# Patient Record
Sex: Male | Born: 1990 | Race: White | Hispanic: No | Marital: Single | Smoking: Former smoker
Health system: Southern US, Community
[De-identification: ages and names within clinical notes are randomized; demographics above are authoritative.]

## PROBLEM LIST (undated history)

## (undated) DIAGNOSIS — R05 Cough: Secondary | ICD-10-CM

## (undated) DIAGNOSIS — T07XXXA Unspecified multiple injuries, initial encounter: Secondary | ICD-10-CM

## (undated) DIAGNOSIS — S82142A Displaced bicondylar fracture of left tibia, initial encounter for closed fracture: Secondary | ICD-10-CM

## (undated) HISTORY — PX: WISDOM TOOTH EXTRACTION: SHX21

---

## 2016-11-25 ENCOUNTER — Emergency Department (HOSPITAL_BASED_OUTPATIENT_CLINIC_OR_DEPARTMENT_OTHER)
Admission: EM | Admit: 2016-11-25 | Discharge: 2016-11-26 | Disposition: A | Discharge status: Home / Self Care | Payer: Self-pay | Attending: Emergency Medicine | Admitting: Emergency Medicine

## 2016-11-25 ENCOUNTER — Emergency Department (HOSPITAL_BASED_OUTPATIENT_CLINIC_OR_DEPARTMENT_OTHER): Payer: Self-pay

## 2016-11-25 ENCOUNTER — Encounter (HOSPITAL_BASED_OUTPATIENT_CLINIC_OR_DEPARTMENT_OTHER): Payer: Self-pay | Admitting: *Deleted

## 2016-11-25 ENCOUNTER — Other Ambulatory Visit: Payer: Self-pay

## 2016-11-25 DIAGNOSIS — Y998 Other external cause status: Secondary | ICD-10-CM | POA: Insufficient documentation

## 2016-11-25 DIAGNOSIS — S82142A Displaced bicondylar fracture of left tibia, initial encounter for closed fracture: Secondary | ICD-10-CM | POA: Insufficient documentation

## 2016-11-25 DIAGNOSIS — W208XXA Other cause of strike by thrown, projected or falling object, initial encounter: Secondary | ICD-10-CM | POA: Insufficient documentation

## 2016-11-25 DIAGNOSIS — F1210 Cannabis abuse, uncomplicated: Secondary | ICD-10-CM | POA: Insufficient documentation

## 2016-11-25 DIAGNOSIS — Y9389 Activity, other specified: Secondary | ICD-10-CM | POA: Insufficient documentation

## 2016-11-25 DIAGNOSIS — S8702XA Crushing injury of left knee, initial encounter: Secondary | ICD-10-CM | POA: Insufficient documentation

## 2016-11-25 DIAGNOSIS — Y929 Unspecified place or not applicable: Secondary | ICD-10-CM | POA: Insufficient documentation

## 2016-11-25 DIAGNOSIS — F17200 Nicotine dependence, unspecified, uncomplicated: Secondary | ICD-10-CM | POA: Insufficient documentation

## 2016-11-25 DIAGNOSIS — T07XXXA Unspecified multiple injuries, initial encounter: Secondary | ICD-10-CM

## 2016-11-25 HISTORY — DX: Unspecified multiple injuries, initial encounter: T07.XXXA

## 2016-11-25 HISTORY — DX: Displaced bicondylar fracture of left tibia, initial encounter for closed fracture: S82.142A

## 2016-11-25 MED ORDER — OXYCODONE-ACETAMINOPHEN 5-325 MG PO TABS
2.0000 | ORAL_TABLET | Freq: Once | ORAL | Status: AC | PRN
  Administered 2016-11-25: 2 via ORAL
  Filled 2016-11-25: qty 2

## 2016-11-25 NOTE — ED Provider Notes (Addendum)
[facility] Provider Note: Lowella DellJ. Lane Adelita Hone, MD, FACEP  CSN: [id number] MRN: [id number] ARRIVAL: 11/25/16 at 2144 ROOM: MH07/MH07   CHIEF COMPLAINT  Knee Injury   HISTORY OF PRESENT ILLNESS  11/25/16 10:54 PM Karene FryCharles Bloom is a 26 y.o. male who had a stack of lumber fall onto his left knee about 8 PM.  He is having pain and swelling in his left knee.  He rates his pain as a 2 out of 10 at rest.  Pain is worse with attempted movement or palpation.  He is not able to bear weight on that leg.  There is no numbness, or functional deficit distally.  He also struck his head and back when this occurred but is having minimal pain there.  He denies loss of consciousness, nausea or vomiting.  [facility] [place] [facility] [place] substances [place] reveals the patient has received one prescription for hydrocodone in the past year.   History reviewed. No pertinent past medical history.  History reviewed. No pertinent surgical history.  No family history on file.  Social History   Tobacco Use  . Smoking status: Current Some Day Smoker  . Smokeless tobacco: Never Used  Substance Use Topics  . Alcohol use: Yes  . Drug use: Yes    Types: Marijuana    Prior to Admission medications   Not on File    Allergies Patient has no known allergies.   REVIEW OF SYSTEMS  Negative except as noted here or in the History of Present Illness.   PHYSICAL EXAMINATION  Initial Vital Signs Blood pressure 102/62, pulse 85, temperature 98.4 F (36.9 C), temperature source Oral, resp. rate 18, height 5\' 11"  (1.803 m), weight 68 kg (150 lb), SpO2 100 %.  Examination General: Well-developed, well-nourished male in no acute distress; appearance consistent with age of record HENT: normocephalic; small left occipital hematoma Eyes: pupils equal, round and reactive to light; extraocular muscles intact Neck: supple; no C-spine tenderness Heart: regular rate and rhythm Lungs:  clear to auscultation bilaterally Abdomen: soft; nondistended; nontender; bowel sounds present Back: No T-spine or L-spine tenderness Extremities: No deformity; full range of motion except right knee; pulses normal; tenderness and swelling of right knee with palpable effusion, right lower extremity distally neurovascularly intact with intact tendon function, compartments soft Neurologic: Awake, alert and oriented; motor function intact in all extremities and symmetric; no facial droop Skin: Warm and dry Psychiatric: Normal mood and affect   RESULTS  Summary of this visit's results, reviewed by myself:   EKG Interpretation  Date/Time:    Ventricular Rate:    PR Interval:    QRS Duration:   QT Interval:    QTC Calculation:   R Axis:     Text Interpretation:        Laboratory Studies: No results found for this or any previous visit (from the past 24 hour(s)). Imaging Studies: Dg Knee Complete 4 Views Left  Result Date: 11/25/2016 CLINICAL DATA:  26 y/o  M; crush injury of the knee with pain. EXAM: LEFT KNEE - COMPLETE 4+ VIEW COMPARISON:  None. FINDINGS: Comminuted acute fracture with mild displacement involving the lateral tibial plateau extending into the proximal metaphysis. Fracture line extends to the lateral tibial spine. No joint dislocation. Moderate joint effusion. IMPRESSION: Acute comminuted and displaced fracture of the lateral tibial plateau with fractures extending into lateral tibial spine and proximal metaphysis. Moderate joint effusion. Electronically Signed   By: Mitzi HansenLance  Furusawa-Stratton M.D.   On: 11/25/2016 22:40  ED COURSE  Nursing notes and initial vitals signs, including pulse oximetry, reviewed.  Vitals:   11/25/16 2151 11/26/16 0050  BP: 102/62 126/70  Pulse: 85 88  Resp: 18 18  Temp: 98.4 F (36.9 C)   TempSrc: Oral   SpO2: 100% 99%  Weight: 68 kg (150 lb)   Height: 5\' 11"  (1.803 m)     PROCEDURES   Patient placed in knee immobilizer and  crutches.  He will be strictly nonweightbearing and we will have him follow-up with Dr. Eulah PontMurphy.  Compartments are still soft and distal pulses are +2.  ED DIAGNOSES     ICD-10-CM   1. Tibial plateau fracture, left, closed, initial encounter S82.142A   2. Crushing injury of left knee, initial encounter [id number]        Lisbeth Puller, MD 11/26/16 [id number]    Paula LibraMolpus, Shmiel Morton, MD 11/26/16 [id number]

## 2016-11-25 NOTE — ED Triage Notes (Signed)
Left knee injury. A wall of wood came down hitting him causing him to fall. Injury to his left knee.

## 2016-11-26 ENCOUNTER — Other Ambulatory Visit (HOSPITAL_COMMUNITY): Payer: Self-pay | Admitting: Orthopedic Surgery

## 2016-11-26 DIAGNOSIS — M25562 Pain in left knee: Secondary | ICD-10-CM

## 2016-11-26 MED ORDER — OXYCODONE-ACETAMINOPHEN 5-325 MG PO TABS: 1.0000 | ORAL_TABLET | ORAL | 0 refills | Status: DC | PRN

## 2016-12-01 ENCOUNTER — Other Ambulatory Visit: Payer: Self-pay

## 2016-12-01 ENCOUNTER — Encounter (HOSPITAL_COMMUNITY): Payer: Self-pay

## 2016-12-01 ENCOUNTER — Ambulatory Visit (HOSPITAL_COMMUNITY)
Admission: RE | Admit: 2016-12-01 | Discharge: 2016-12-01 | Disposition: A | Discharge status: Home / Self Care | Payer: Worker's Compensation | Source: Ambulatory Visit | Attending: Orthopedic Surgery | Admitting: Orthopedic Surgery

## 2016-12-01 ENCOUNTER — Encounter (HOSPITAL_BASED_OUTPATIENT_CLINIC_OR_DEPARTMENT_OTHER): Payer: Self-pay | Admitting: *Deleted

## 2016-12-01 DIAGNOSIS — X58XXXA Exposure to other specified factors, initial encounter: Secondary | ICD-10-CM | POA: Diagnosis not present

## 2016-12-01 DIAGNOSIS — M25562 Pain in left knee: Secondary | ICD-10-CM

## 2016-12-01 DIAGNOSIS — S82142A Displaced bicondylar fracture of left tibia, initial encounter for closed fracture: Secondary | ICD-10-CM | POA: Diagnosis not present

## 2016-12-01 DIAGNOSIS — R059 Cough, unspecified: Secondary | ICD-10-CM

## 2016-12-01 HISTORY — DX: Cough, unspecified: R05.9

## 2016-12-01 NOTE — H&P (Signed)
[facility] [address]   [address] [place]  [id number] [phone]  A Division of [facility]  RE: Edwin Coleman, Edwin Coleman                                  [id number]         DOB: 11/04/1990 INITIAL EVALUATION 11/26/2016  Reason for visit:  Left knee injury.   HPI: On 11/25/2016 he was at work when a large object fell on his left knee.  He was forced into a valgus moment.  [facility] done and was referred to me with lateral plateau fracture.   OBJECTIVE: The patient is a well appearing male, in no apparent distress.  His compartments are soft.  He has ecchymosis and swelling laterally.  He is neurovascularly intact.    IMAGES: X-rays show severe lateral plateau fracture.  Alignment is good.    ASSESSMENT/PLAN:  He needs open reduction and internal fixation of this fracture.  We are going to obtain a CAT scan first.  He will likely need meniscal repair and anterior compartment fasciotomies after surgery to prevent swelling here.  We will set this up for Tuesday.      Jewel Baizeimothy D.  Eulah PontMurphy, M.D.  Electronically verified by Jewel Baizeimothy D. Eulah PontMurphy, M.D. TDM:pmw D 11/26/16 T 12/01/16

## 2016-12-02 ENCOUNTER — Ambulatory Visit (HOSPITAL_COMMUNITY): Payer: Worker's Compensation

## 2016-12-02 ENCOUNTER — Other Ambulatory Visit: Payer: Self-pay

## 2016-12-02 ENCOUNTER — Encounter (HOSPITAL_BASED_OUTPATIENT_CLINIC_OR_DEPARTMENT_OTHER)
Admission: RE | Disposition: A | Discharge status: Home / Self Care | Payer: Self-pay | Source: Ambulatory Visit | Attending: Orthopedic Surgery

## 2016-12-02 ENCOUNTER — Encounter (HOSPITAL_BASED_OUTPATIENT_CLINIC_OR_DEPARTMENT_OTHER): Payer: Self-pay | Admitting: Anesthesiology

## 2016-12-02 ENCOUNTER — Ambulatory Visit (HOSPITAL_BASED_OUTPATIENT_CLINIC_OR_DEPARTMENT_OTHER)
Admission: RE | Admit: 2016-12-02 | Discharge: 2016-12-03 | Disposition: A | Discharge status: Home / Self Care | Payer: Worker's Compensation | Source: Ambulatory Visit | Attending: Orthopedic Surgery | Admitting: Orthopedic Surgery

## 2016-12-02 ENCOUNTER — Ambulatory Visit (HOSPITAL_BASED_OUTPATIENT_CLINIC_OR_DEPARTMENT_OTHER): Payer: Worker's Compensation | Admitting: Anesthesiology

## 2016-12-02 DIAGNOSIS — S83282A Other tear of lateral meniscus, current injury, left knee, initial encounter: Secondary | ICD-10-CM | POA: Diagnosis not present

## 2016-12-02 DIAGNOSIS — Y990 Civilian activity done for income or pay: Secondary | ICD-10-CM | POA: Insufficient documentation

## 2016-12-02 DIAGNOSIS — Z4789 Encounter for other orthopedic aftercare: Secondary | ICD-10-CM

## 2016-12-02 DIAGNOSIS — W208XXA Other cause of strike by thrown, projected or falling object, initial encounter: Secondary | ICD-10-CM | POA: Diagnosis not present

## 2016-12-02 DIAGNOSIS — S82142A Displaced bicondylar fracture of left tibia, initial encounter for closed fracture: Secondary | ICD-10-CM | POA: Diagnosis present

## 2016-12-02 DIAGNOSIS — Z87891 Personal history of nicotine dependence: Secondary | ICD-10-CM | POA: Diagnosis not present

## 2016-12-02 HISTORY — DX: Unspecified multiple injuries, initial encounter: T07.XXXA

## 2016-12-02 HISTORY — PX: ORIF TIBIA PLATEAU: SHX2132

## 2016-12-02 HISTORY — DX: Cough: R05

## 2016-12-02 HISTORY — DX: Displaced bicondylar fracture of left tibia, initial encounter for closed fracture: S82.142A

## 2016-12-02 SURGERY — OPEN REDUCTION INTERNAL FIXATION (ORIF) TIBIAL PLATEAU
Anesthesia: General | Site: Knee | Laterality: Left

## 2016-12-02 MED ORDER — FENTANYL CITRATE (PF) 100 MCG/2ML IJ SOLN
INTRAMUSCULAR | Status: AC
  Filled 2016-12-02: qty 2

## 2016-12-02 MED ORDER — HYDROMORPHONE HCL 1 MG/ML IJ SOLN
0.5000 mg | Freq: Once | INTRAMUSCULAR | Status: AC
  Administered 2016-12-02: 0.5 mg via INTRAVENOUS

## 2016-12-02 MED ORDER — POLYETHYLENE GLYCOL 3350 17 G PO PACK: 17.0000 g | PACK | Freq: Every day | ORAL | Status: DC | PRN

## 2016-12-02 MED ORDER — DIPHENHYDRAMINE HCL 12.5 MG/5ML PO ELIX: 12.5000 mg | ORAL_SOLUTION | ORAL | Status: DC | PRN

## 2016-12-02 MED ORDER — SENNA 8.6 MG PO TABS
1.0000 | ORAL_TABLET | Freq: Two times a day (BID) | ORAL | Status: DC
  Administered 2016-12-02: 8.6 mg via ORAL
  Filled 2016-12-02: qty 1

## 2016-12-02 MED ORDER — DEXAMETHASONE SODIUM PHOSPHATE 10 MG/ML IJ SOLN
INTRAMUSCULAR | Status: DC | PRN
  Administered 2016-12-02: 10 mg via INTRAVENOUS

## 2016-12-02 MED ORDER — GABAPENTIN 300 MG PO CAPS
300.0000 mg | ORAL_CAPSULE | Freq: Once | ORAL | Status: AC
  Administered 2016-12-02: 300 mg via ORAL

## 2016-12-02 MED ORDER — LACTATED RINGERS IV SOLN: INTRAVENOUS | Status: DC

## 2016-12-02 MED ORDER — PROPOFOL 10 MG/ML IV BOLUS
INTRAVENOUS | Status: AC
  Filled 2016-12-02: qty 20

## 2016-12-02 MED ORDER — METHOCARBAMOL 500 MG PO TABS
500.0000 mg | ORAL_TABLET | Freq: Four times a day (QID) | ORAL | Status: DC | PRN
  Administered 2016-12-02 – 2016-12-03 (×2): 500 mg via ORAL
  Filled 2016-12-02 (×2): qty 1

## 2016-12-02 MED ORDER — LIDOCAINE 2% (20 MG/ML) 5 ML SYRINGE
INTRAMUSCULAR | Status: DC | PRN
  Administered 2016-12-02: 60 mg via INTRAVENOUS

## 2016-12-02 MED ORDER — METHOCARBAMOL 500 MG PO TABS: 500.0000 mg | ORAL_TABLET | Freq: Four times a day (QID) | ORAL | 0 refills | Status: DC | PRN

## 2016-12-02 MED ORDER — FENTANYL CITRATE (PF) 100 MCG/2ML IJ SOLN
50.0000 ug | INTRAMUSCULAR | Status: AC | PRN
  Administered 2016-12-02: 100 ug via INTRAVENOUS
  Administered 2016-12-02 (×6): 50 ug via INTRAVENOUS

## 2016-12-02 MED ORDER — HYDROMORPHONE HCL 1 MG/ML IJ SOLN
INTRAMUSCULAR | Status: AC
  Filled 2016-12-02: qty 1

## 2016-12-02 MED ORDER — GABAPENTIN 300 MG PO CAPS
ORAL_CAPSULE | ORAL | Status: AC
  Filled 2016-12-02: qty 1

## 2016-12-02 MED ORDER — DOCUSATE SODIUM 100 MG PO CAPS: 100.0000 mg | ORAL_CAPSULE | Freq: Two times a day (BID) | ORAL | 0 refills | Status: DC

## 2016-12-02 MED ORDER — LACTATED RINGERS IV SOLN
INTRAVENOUS | Status: DC
  Administered 2016-12-02: 16:00:00 via INTRAVENOUS

## 2016-12-02 MED ORDER — ACETAMINOPHEN 650 MG RE SUPP
650.0000 mg | RECTAL | Status: DC | PRN
Start: 2016-12-02 — End: 2016-12-03

## 2016-12-02 MED ORDER — MIDAZOLAM HCL 2 MG/2ML IJ SOLN
INTRAMUSCULAR | Status: AC
  Filled 2016-12-02: qty 2

## 2016-12-02 MED ORDER — METOCLOPRAMIDE HCL 5 MG PO TABS: 5.0000 mg | ORAL_TABLET | Freq: Three times a day (TID) | ORAL | Status: DC | PRN

## 2016-12-02 MED ORDER — ROPIVACAINE HCL 7.5 MG/ML IJ SOLN
INTRAMUSCULAR | Status: DC | PRN
  Administered 2016-12-02: 20 mL via PERINEURAL

## 2016-12-02 MED ORDER — OXYCODONE HCL 5 MG PO TABS: 5.0000 mg | ORAL_TABLET | ORAL | Status: DC | PRN

## 2016-12-02 MED ORDER — METOCLOPRAMIDE HCL 5 MG/ML IJ SOLN: 5.0000 mg | Freq: Three times a day (TID) | INTRAMUSCULAR | Status: DC | PRN

## 2016-12-02 MED ORDER — HYDROMORPHONE HCL 1 MG/ML IJ SOLN
INTRAMUSCULAR | Status: AC
  Filled 2016-12-02: qty 0.5

## 2016-12-02 MED ORDER — ONDANSETRON HCL 4 MG/2ML IJ SOLN
INTRAMUSCULAR | Status: DC | PRN
Start: 2016-12-02 — End: 2016-12-02
  Administered 2016-12-02: 4 mg via INTRAVENOUS

## 2016-12-02 MED ORDER — ASPIRIN EC 81 MG PO TBEC: 81.0000 mg | DELAYED_RELEASE_TABLET | Freq: Every day | ORAL | 0 refills | Status: DC

## 2016-12-02 MED ORDER — MIDAZOLAM HCL 2 MG/2ML IJ SOLN
1.0000 mg | INTRAMUSCULAR | Status: DC | PRN
  Administered 2016-12-02 (×2): 2 mg via INTRAVENOUS

## 2016-12-02 MED ORDER — ONDANSETRON HCL 4 MG/2ML IJ SOLN
INTRAMUSCULAR | Status: AC
Start: 2016-12-02 — End: 2016-12-02
  Filled 2016-12-02: qty 2

## 2016-12-02 MED ORDER — PROPOFOL 10 MG/ML IV BOLUS
INTRAVENOUS | Status: DC | PRN
  Administered 2016-12-02: 160 mg via INTRAVENOUS

## 2016-12-02 MED ORDER — CHLORHEXIDINE GLUCONATE 4 % EX LIQD: 60.0000 mL | Freq: Once | CUTANEOUS | Status: DC

## 2016-12-02 MED ORDER — ACETAMINOPHEN 500 MG PO TABS
ORAL_TABLET | ORAL | Status: AC
  Filled 2016-12-02: qty 2

## 2016-12-02 MED ORDER — DEXAMETHASONE SODIUM PHOSPHATE 10 MG/ML IJ SOLN
INTRAMUSCULAR | Status: AC
  Filled 2016-12-02: qty 1

## 2016-12-02 MED ORDER — CEFAZOLIN SODIUM-DEXTROSE 2-4 GM/100ML-% IV SOLN
2.0000 g | INTRAVENOUS | Status: AC
  Administered 2016-12-02: 2 g via INTRAVENOUS

## 2016-12-02 MED ORDER — LACTATED RINGERS IV SOLN
INTRAVENOUS | Status: DC
  Administered 2016-12-02 (×3): via INTRAVENOUS

## 2016-12-02 MED ORDER — ACETAMINOPHEN 325 MG PO TABS
650.0000 mg | ORAL_TABLET | ORAL | Status: DC | PRN
  Administered 2016-12-02 – 2016-12-03 (×3): 650 mg via ORAL
  Filled 2016-12-02 (×3): qty 2

## 2016-12-02 MED ORDER — FENTANYL CITRATE (PF) 100 MCG/2ML IJ SOLN
25.0000 ug | INTRAMUSCULAR | Status: DC | PRN
  Administered 2016-12-02 (×2): 50 ug via INTRAVENOUS

## 2016-12-02 MED ORDER — DOCUSATE SODIUM 100 MG PO CAPS
100.0000 mg | ORAL_CAPSULE | Freq: Two times a day (BID) | ORAL | Status: DC
  Administered 2016-12-02: 100 mg via ORAL
  Filled 2016-12-02: qty 1

## 2016-12-02 MED ORDER — METHOCARBAMOL 1000 MG/10ML IJ SOLN
500.0000 mg | Freq: Four times a day (QID) | INTRAVENOUS | Status: DC | PRN
  Administered 2016-12-02: 500 mg via INTRAVENOUS

## 2016-12-02 MED ORDER — ACETAMINOPHEN 500 MG PO TABS
1000.0000 mg | ORAL_TABLET | Freq: Once | ORAL | Status: AC
  Administered 2016-12-02: 1000 mg via ORAL

## 2016-12-02 MED ORDER — OXYCODONE-ACETAMINOPHEN 5-325 MG PO TABS: 1.0000 | ORAL_TABLET | ORAL | 0 refills | Status: DC | PRN

## 2016-12-02 MED ORDER — ONDANSETRON HCL 4 MG PO TABS: 4.0000 mg | ORAL_TABLET | Freq: Four times a day (QID) | ORAL | Status: DC | PRN

## 2016-12-02 MED ORDER — SCOPOLAMINE 1 MG/3DAYS TD PT72: 1.0000 | MEDICATED_PATCH | Freq: Once | TRANSDERMAL | Status: DC | PRN

## 2016-12-02 MED ORDER — CEFAZOLIN SODIUM-DEXTROSE 2-4 GM/100ML-% IV SOLN
INTRAVENOUS | Status: AC
  Filled 2016-12-02: qty 100

## 2016-12-02 MED ORDER — ONDANSETRON HCL 4 MG/2ML IJ SOLN: 4.0000 mg | Freq: Four times a day (QID) | INTRAMUSCULAR | Status: DC | PRN

## 2016-12-02 MED ORDER — OXYCODONE HCL 5 MG PO TABS
10.0000 mg | ORAL_TABLET | ORAL | Status: DC | PRN
  Administered 2016-12-02 – 2016-12-03 (×5): 10 mg via ORAL
  Filled 2016-12-02 (×5): qty 2

## 2016-12-02 MED ORDER — CELECOXIB 200 MG PO CAPS
200.0000 mg | ORAL_CAPSULE | Freq: Two times a day (BID) | ORAL | Status: DC
  Administered 2016-12-02 (×2): 200 mg via ORAL
  Filled 2016-12-02 (×2): qty 1

## 2016-12-02 MED ORDER — CEFAZOLIN SODIUM-DEXTROSE 1-4 GM/50ML-% IV SOLN
1.0000 g | Freq: Four times a day (QID) | INTRAVENOUS | Status: DC
  Administered 2016-12-02 – 2016-12-03 (×2): 1 g via INTRAVENOUS
  Filled 2016-12-02 (×4): qty 50

## 2016-12-02 MED ORDER — HYDROMORPHONE HCL 1 MG/ML IJ SOLN
0.5000 mg | INTRAMUSCULAR | Status: DC | PRN
  Administered 2016-12-02: 1 mg via INTRAVENOUS

## 2016-12-02 MED ORDER — HYDROMORPHONE HCL 1 MG/ML PO LIQD
1.0000 mg | Freq: Four times a day (QID) | ORAL | Status: DC | PRN
  Administered 2016-12-02: 1 mg via ORAL

## 2016-12-02 MED ORDER — ONDANSETRON HCL 4 MG PO TABS: 4.0000 mg | ORAL_TABLET | Freq: Three times a day (TID) | ORAL | 0 refills | Status: DC | PRN

## 2016-12-02 MED ORDER — ENOXAPARIN SODIUM 40 MG/0.4ML ~~LOC~~ SOLN
40.0000 mg | SUBCUTANEOUS | Status: DC
  Administered 2016-12-02: 40 mg via SUBCUTANEOUS
  Filled 2016-12-02: qty 0.4

## 2016-12-02 SURGICAL SUPPLY — 92 items
BAG DECANTER FOR FLEXI CONT (MISCELLANEOUS) IMPLANT
BANDAGE ACE 6X5 VEL STRL LF (GAUZE/BANDAGES/DRESSINGS) ×3 IMPLANT
BANDAGE ESMARK 6X9 LF (GAUZE/BANDAGES/DRESSINGS) ×1 IMPLANT
BIT DRILL 2.5 (BIT) ×1
BIT DRILL 2.5MM (BIT) ×1
BIT DRILL 3.1 (BIT) ×2 IMPLANT
BIT DRILL 3.1MM (BIT) ×1
BIT DRILL SHRT 216X2.5XNS LF (BIT) ×1 IMPLANT
BIT DRL SHRT 216X2.5XNS LF (BIT) ×1
BLADE SURG 15 STRL LF DISP TIS (BLADE) ×2 IMPLANT
BLADE SURG 15 STRL SS (BLADE) ×4
BNDG COHESIVE 4X5 TAN STRL (GAUZE/BANDAGES/DRESSINGS) ×3 IMPLANT
BNDG ESMARK 6X9 LF (GAUZE/BANDAGES/DRESSINGS) ×3
CANISTER SUCT 1200ML W/VALVE (MISCELLANEOUS) ×3 IMPLANT
CHLORAPREP W/TINT 26ML (MISCELLANEOUS) ×3 IMPLANT
CLOSURE STERI-STRIP 1/2X4 (GAUZE/BANDAGES/DRESSINGS) ×1
CLSR STERI-STRIP ANTIMIC 1/2X4 (GAUZE/BANDAGES/DRESSINGS) ×2 IMPLANT
COVER BACK TABLE 60X90IN (DRAPES) ×3 IMPLANT
COVER MAYO STAND STRL (DRAPES) ×3 IMPLANT
CUFF TOURNIQUET SINGLE 34IN LL (TOURNIQUET CUFF) ×3 IMPLANT
DRAPE C-ARM 42X72 X-RAY (DRAPES) ×3 IMPLANT
DRAPE C-ARMOR (DRAPES) ×3 IMPLANT
DRAPE EXTREMITY T 121X128X90 (DRAPE) ×3 IMPLANT
DRAPE IMP U-DRAPE 54X76 (DRAPES) ×3 IMPLANT
DRAPE U-SHAPE 47X51 STRL (DRAPES) ×3 IMPLANT
DRSG EMULSION OIL 3X3 NADH (GAUZE/BANDAGES/DRESSINGS) ×3 IMPLANT
DRSG PAD ABDOMINAL 8X10 ST (GAUZE/BANDAGES/DRESSINGS) ×3 IMPLANT
ELECT REM PT RETURN 9FT ADLT (ELECTROSURGICAL) ×3
ELECTRODE REM PT RTRN 9FT ADLT (ELECTROSURGICAL) ×1 IMPLANT
GAUZE SPONGE 4X4 12PLY STRL (GAUZE/BANDAGES/DRESSINGS) ×3 IMPLANT
GAUZE XEROFORM 1X8 LF (GAUZE/BANDAGES/DRESSINGS) IMPLANT
GLOVE BIO SURGEON STRL SZ 6.5 (GLOVE) ×2 IMPLANT
GLOVE BIO SURGEON STRL SZ7.5 (GLOVE) ×6 IMPLANT
GLOVE BIO SURGEONS STRL SZ 6.5 (GLOVE) ×1
GLOVE BIOGEL PI IND STRL 7.0 (GLOVE) ×1 IMPLANT
GLOVE BIOGEL PI IND STRL 8 (GLOVE) ×2 IMPLANT
GLOVE BIOGEL PI INDICATOR 7.0 (GLOVE) ×2
GLOVE BIOGEL PI INDICATOR 8 (GLOVE) ×4
GLOVE ECLIPSE 6.5 STRL STRAW (GLOVE) IMPLANT
GOWN STRL REUS W/ TWL LRG LVL3 (GOWN DISPOSABLE) ×2 IMPLANT
GOWN STRL REUS W/ TWL XL LVL3 (GOWN DISPOSABLE) ×1 IMPLANT
GOWN STRL REUS W/TWL LRG LVL3 (GOWN DISPOSABLE) ×4
GOWN STRL REUS W/TWL XL LVL3 (GOWN DISPOSABLE) ×8 IMPLANT
IMMOBILIZER KNEE 22 UNIV (SOFTGOODS) IMPLANT
IMMOBILIZER KNEE 24 THIGH 36 (MISCELLANEOUS) ×1 IMPLANT
IMMOBILIZER KNEE 24 UNIV (MISCELLANEOUS) ×3
K-WIRE SMOOTH 2.0X150 (WIRE) ×6
KWIRE SMOOTH 2.0X150 (WIRE) ×2 IMPLANT
NEEDLE HYPO 25X1 1.5 SAFETY (NEEDLE) IMPLANT
NS IRRIG 1000ML POUR BTL (IV SOLUTION) ×3 IMPLANT
PACK BASIN DAY SURGERY FS (CUSTOM PROCEDURE TRAY) ×3 IMPLANT
PAD CAST 4YDX4 CTTN HI CHSV (CAST SUPPLIES) ×2 IMPLANT
PADDING CAST COTTON 4X4 STRL (CAST SUPPLIES) ×4
PENCIL BUTTON HOLSTER BLD 10FT (ELECTRODE) ×3 IMPLANT
PLATE TIBIA LAT PROX LT 2H (Plate) ×3 IMPLANT
SCREW CANC.4.0X55MM (Screw) ×3 IMPLANT
SCREW CANCELLOUS 4.0X50MM FT (Screw) ×3 IMPLANT
SCREW CORT 30X3.5XST LCK NS (Screw) ×1 IMPLANT
SCREW CORT 34X3.5XST LCK NS (Screw) ×1 IMPLANT
SCREW CORTICAL 3.5X30MM (Screw) ×2 IMPLANT
SCREW CORTICAL 3.5X34MM (Screw) ×2 IMPLANT
SCREW LOCKING 4.0X50MM (Screw) ×3 IMPLANT
SCREW LOCKING 4.0X70MM (Screw) ×3 IMPLANT
SCREW LOCKING 65X4.0MM (Screw) ×3 IMPLANT
SCREW LOCKING 75X4.0MM (Screw) ×6 IMPLANT
SCREW PARTIAL THREAD 4X70MM (Screw) ×3 IMPLANT
SLEEVE SCD COMPRESS KNEE MED (MISCELLANEOUS) ×3 IMPLANT
SPONGE LAP 4X18 X RAY DECT (DISPOSABLE) ×3 IMPLANT
STAPLER VISISTAT 35W (STAPLE) IMPLANT
STOCKINETTE 6  STRL (DRAPES)
STOCKINETTE 6 STRL (DRAPES) IMPLANT
STOCKINETTE IMPERVIOUS LG (DRAPES) ×3 IMPLANT
SUCTION FRAZIER HANDLE 10FR (MISCELLANEOUS) ×2
SUCTION TUBE FRAZIER 10FR DISP (MISCELLANEOUS) ×1 IMPLANT
SUT ETHILON 3 0 PS 1 (SUTURE) IMPLANT
SUT FIBERWIRE #2 38 T-5 BLUE (SUTURE)
SUT FIBERWIRE 2-0 18 17.9 3/8 (SUTURE) ×9
SUT MNCRL AB 4-0 PS2 18 (SUTURE) ×3 IMPLANT
SUT MON AB 2-0 CT1 36 (SUTURE) ×6 IMPLANT
SUT STEEL 7 (SUTURE) IMPLANT
SUT VIC AB 0 CT1 27 (SUTURE) ×4
SUT VIC AB 0 CT1 27XBRD ANBCTR (SUTURE) ×2 IMPLANT
SUT VIC AB 2-0 SH 27 (SUTURE)
SUT VIC AB 2-0 SH 27XBRD (SUTURE) IMPLANT
SUTURE FIBERWR #2 38 T-5 BLUE (SUTURE) IMPLANT
SUTURE FIBERWR 2-0 18 17.9 3/8 (SUTURE) ×3 IMPLANT
SYR BULB 3OZ (MISCELLANEOUS) ×3 IMPLANT
SYR CONTROL 10ML LL (SYRINGE) IMPLANT
TUBE CONNECTING 20'X1/4 (TUBING) ×1
TUBE CONNECTING 20X1/4 (TUBING) ×2 IMPLANT
UNDERPAD 30X30 (UNDERPADS AND DIAPERS) ×3 IMPLANT
YANKAUER SUCT BULB TIP NO VENT (SUCTIONS) ×3 IMPLANT

## 2016-12-02 NOTE — Anesthesia Procedure Notes (Signed)
Anesthesia Regional Block: Adductor canal block   Pre-Anesthetic Checklist: ,, timeout performed, Correct Patient, Correct Site, Correct Laterality, Correct Procedure, Correct Position, site marked, Risks and benefits discussed, pre-op evaluation,  At surgeon's request and post-op pain management  Laterality: Left  Prep: chloraprep       Needles:   Needle Type: Echogenic Needle     Needle Length: 9cm  Needle Gauge: 21     Additional Needles:   Procedures:,,,, ultrasound used (permanent image in chart),,,,  Narrative:  Start time: 12/02/2016 11:55 AM End time: 12/02/2016 12:05 PM Injection made incrementally with aspirations every 5 mL. Anesthesiologist: Cristela BlueJackson, Janera Peugh, MD  Additional Notes: Requested per Dr Eulah PontMurphy

## 2016-12-02 NOTE — Anesthesia Procedure Notes (Signed)
Procedure Name: LMA Insertion Date/Time: 12/02/2016 12:30 PM Performed by: Tyrone NineSauve, Sipriano Fendley F, CRNA Pre-anesthesia Checklist: Patient identified, Timeout performed, Emergency Drugs available, Suction available and Patient being monitored Patient Re-evaluated:Patient Re-evaluated prior to induction Oxygen Delivery Method: Circle system utilized Preoxygenation: Pre-oxygenation with 100% oxygen Induction Type: IV induction Ventilation: Mask ventilation without difficulty LMA: LMA inserted LMA Size: 4.0 Number of attempts: 1 Placement Confirmation: positive ETCO2 and breath sounds checked- equal and bilateral Tube secured with: Tape Dental Injury: Teeth and Oropharynx as per pre-operative assessment

## 2016-12-02 NOTE — Op Note (Signed)
12/02/2016  3:10 PM  PATIENT:  Edwin Coleman    PRE-OPERATIVE DIAGNOSIS:  left tibial plateau fracture  POST-OPERATIVE DIAGNOSIS:  Same  PROCEDURE:  OPEN REDUCTION INTERNAL FIXATION (ORIF) LEFT TIBIAL PLATEAU WITH LATERAL MENISCECTOMY  SURGEON:  Chasey Dull, Jewel BaizeIMOTHY D, MD  ASSISTANT: Aquilla HackerHenry Martensen, PA-C, he was present and scrubbed throughout the case, critical for completion in a timely fashion, and for retraction, instrumentation, and closure.   ANESTHESIA:   gen  PREOPERATIVE INDICATIONS:  Edwin Coleman is a  26 y.o. male with a diagnosis of left tibial plateau fracture who failed conservative measures and elected for surgical management.    The risks benefits and alternatives were discussed with the patient preoperatively including but not limited to the risks of infection, bleeding, nerve injury, cardiopulmonary complications, the need for revision surgery, among others, and the patient was willing to proceed.  OPERATIVE IMPLANTS: stryker lat locking plate  OPERATIVE FINDINGS: large meniscal tear, comminuted lateral plateau  BLOOD LOSS: min  COMPLICATIONS: none  TOURNIQUET TIME: 75min  OPERATIVE PROCEDURE:  Patient was identified in the preoperative holding area and site was marked by me He was transported to the operating theater and placed on the table in supine position taking care to pad all bony prominences. After a preincinduction time out anesthesia was induced. The left lower extremity was prepped and draped in normal sterile fashion and a pre-incision timeout was performed. He received ancef for preoperative antibiotics.   I made an anterior lateral incision over his proximal tibia this is a curvilinear incision.  I dissected down to his IT band split this anteriorly and elevated off of its insertion to make room for the plate.  I then performed an anterior compartment fasciotomy decompress this and elevated room on the bone to like the plate distally.  Next I  performed a sub-meniscal arthrotomy he has torn the anterior half of his meniscus away from the capsule on the lateral side.  I placed stitches repairing this back to the capsule.  I reduced his joint fragment pieces back in the place and then closed down the anterior piece of the joint.  And pinned this into place.  I then selected a lateral locking plate fitted in the place and pinned there.  I took multiple x-rays as happy with the joint reduction and hardware placement.  I placed a shaft screw as well as a nonlocking through in the proximal plate.  Again I was happy with the alignment on x-rays I then placed multiple locking screws proximally followed by 2 additional shaft screws.  I was happy with the placement of all hardware and took final x-rays and was happy with the fracture reduction visually inspected as well and was again happy.  Next closed the capsule down to the plate closed her IT band in place I left her anterior compartment fasciotomy open.  Wound skin was joint had been thoroughly irrigated skin was then closed in layers.  Is placed in a sterile dressing awoken and taken the PACU in stable condition  POST OPERATIVE PLAN: NWB, LLE. Mobilize for dvt px

## 2016-12-02 NOTE — Interval H&P Note (Signed)
History and Physical Interval Note:  12/02/2016 11:55 AM  Edwin Coleman Burck  has presented today for surgery, with the diagnosis of left tibial plateau fracture  The various methods of treatment have been discussed with the patient and family. After consideration of risks, benefits and other options for treatment, the patient has consented to  Procedure(s): OPEN REDUCTION INTERNAL FIXATION (ORIF) LEFT TIBIAL PLATEAU WITH LATERAL MENISCECTOMY (Left) as a surgical intervention .  The patient's history has been reviewed, patient examined, no change in status, stable for surgery.  I have reviewed the patient's chart and labs.  Questions were answered to the patient's satisfaction.     Sofia Vanmeter D

## 2016-12-02 NOTE — Discharge Instructions (Signed)
Elevate leg as much as possible to reduce pain / swelling.  Diet: As you were doing prior to hospitalization   Dressing:  Keep dressings on and dry until follow up.  Activity:  Increase activity slowly as tolerated, but follow the weight bearing instructions below.  The rules on driving is that you can not be taking narcotics while you drive, and you must feel in control of the vehicle.    Weight Bearing: Non weight bearing left leg.  Keep knee immobilizer on at all times.  You may remove ice pack that was placed in the surgery center.  To prevent constipation: you may use a stool softener such as -  Colace (over the counter) 100 mg by mouth twice a day  Drink plenty of fluids (prune juice may be helpful) and high fiber foods Miralax (over the counter) for constipation as needed.    Itching:  If you experience itching with your medications, try taking only a single pain pill, or even half a pain pill at a time.  You can also use benadryl over the counter for itching or also to help with sleep.   Precautions:  If you experience chest pain or shortness of breath - call 911 immediately for transfer to the hospital emergency department!!  If you develop a fever greater that 101 F, purulent drainage from wound, increased redness or drainage from wound, or calf pain -- Call the office at [phone]                                                 Follow- Up Appointment:  Please call for an appointment to be seen in 1-2 weeks [place] - [phone]  Post Anesthesia Home Care Instructions  Activity: Get plenty of rest for the remainder of the day. A responsible individual must stay with you for 24 hours following the procedure.  For the next 24 hours, DO NOT: -Drive a car -Advertising copywriterperate machinery -Drink alcoholic beverages -Take any medication unless instructed by your physician -Make any legal decisions or sign important papers.  Meals: Start with liquid foods such as gelatin or soup.  Progress to regular foods as tolerated. Avoid greasy, spicy, heavy foods. If nausea and/or vomiting occur, drink only clear liquids until the nausea and/or vomiting subsides. Call your physician if vomiting continues.  Special Instructions/Symptoms: Your throat may feel dry or sore from the anesthesia or the breathing tube placed in your throat during surgery. If this causes discomfort, gargle with warm salt water. The discomfort should disappear within 24 hours.  If you had a scopolamine patch placed behind your ear for the management of post- operative nausea and/or vomiting:  1. The medication in the patch is effective for 72 hours, after which it should be removed.  Wrap patch in a tissue and discard in the trash. Wash hands thoroughly with soap and water. 2. You may remove the patch earlier than 72 hours if you experience unpleasant side effects which may include dry mouth, dizziness or visual disturbances. 3. Avoid touching the patch. Wash your hands with soap and water after contact with the patch.   Regional Anesthesia Blocks  1. Numbness or the inability to move the "blocked" extremity may last from 3-48 hours after placement. The length of time depends on the medication injected and your individual response to the medication. If the  numbness is not going away after 48 hours, call your surgeon.  2. The extremity that is blocked will need to be protected until the numbness is gone and the  Strength has returned. Because you cannot feel it, you will need to take extra care to avoid injury. Because it may be weak, you may have difficulty moving it or using it. You may not know what position it is in without looking at it while the block is in effect.  3. For blocks in the legs and feet, returning to weight bearing and walking needs to be done carefully. You will need to wait until the numbness is entirely gone and the strength has returned. You should be able to move your leg and foot normally  before you try and bear weight or walk. You will need someone to be with you when you first try to ensure you do not fall and possibly risk injury.  4. Bruising and tenderness at the needle site are common side effects and will resolve in a few days.  5. Persistent numbness or new problems with movement should be communicated to the surgeon or the [facility]  [phone])/ [facility] [phone]).

## 2016-12-02 NOTE — Transfer of Care (Signed)
Immediate Anesthesia Transfer of Care Note  Patient: Edwin Coleman  Procedure(s) Performed: OPEN REDUCTION INTERNAL FIXATION (ORIF) LEFT TIBIAL PLATEAU WITH LATERAL MENISCECTOMY (Left Knee)  Patient Location: PACU  Anesthesia Type:General  Level of Consciousness: awake, alert , oriented and patient cooperative  Airway & Oxygen Therapy: Patient Spontanous Breathing and Patient connected to face mask oxygen  Post-op Assessment: Report given to RN and Post -op Vital signs reviewed and stable  Post vital signs: Reviewed and stable  Last Vitals:  Vitals:   12/02/16 1122 12/02/16 1155  BP: 122/71   Pulse:  66  Resp:  16  Temp:    SpO2:  100%    Last Pain:  Vitals:   12/02/16 1122  TempSrc:   PainSc: 2       Patients Stated Pain Goal: 1 (12/02/16 1122)  Complications: No apparent anesthesia complications

## 2016-12-02 NOTE — Anesthesia Postprocedure Evaluation (Signed)
Anesthesia Post Note  Patient: Edwin FryCharles Walgren  Procedure(s) Performed: OPEN REDUCTION INTERNAL FIXATION (ORIF) LEFT TIBIAL PLATEAU WITH LATERAL MENISCECTOMY (Left Knee)     Patient location during evaluation: PACU Anesthesia Type: General Level of consciousness: awake and alert Pain management: pain level controlled Vital Signs Assessment: post-procedure vital signs reviewed and stable Respiratory status: spontaneous breathing, nonlabored ventilation, respiratory function stable and patient connected to nasal cannula oxygen Cardiovascular status: blood pressure returned to baseline and stable Postop Assessment: no apparent nausea or vomiting Anesthetic complications: no    Last Vitals:  Vitals:   12/02/16 1515 12/02/16 1530  BP: 139/74 132/69  Pulse: 80 75  Resp: 20 15  Temp:    SpO2: 100% 96%    Last Pain:  Vitals:   12/02/16 1530  TempSrc:   PainSc: 3                  Currie Dennin EDWARD

## 2016-12-02 NOTE — Anesthesia Preprocedure Evaluation (Addendum)
Anesthesia Evaluation  Patient identified by MRN, date of birth, ID band Patient awake    Reviewed: Allergy & Precautions, H&P , Patient's Chart, lab work & pertinent test results, reviewed documented beta blocker date and time   Airway Mallampati: II  TM Distance: >3 FB Neck ROM: full    Dental no notable dental hx. (+) Teeth Intact, Dental Advisory Given   Pulmonary former smoker,    Pulmonary exam normal breath sounds clear to auscultation       Cardiovascular  Rhythm:regular Rate:Normal     Neuro/Psych    GI/Hepatic   Endo/Other    Renal/GU      Musculoskeletal   Abdominal   Peds  Hematology   Anesthesia Other Findings   Reproductive/Obstetrics                            Anesthesia Physical Anesthesia Plan  ASA: II  Anesthesia Plan: General   Post-op Pain Management:  Regional for Post-op pain   Induction: Intravenous  PONV Risk Score and Plan: 1 and Treatment may vary due to age or medical condition  Airway Management Planned: LMA  Additional Equipment:   Intra-op Plan:   Post-operative Plan:   Informed Consent: I have reviewed the patients History and Physical, chart, labs and discussed the procedure including the risks, benefits and alternatives for the proposed anesthesia with the patient or authorized representative who has indicated his/her understanding and acceptance.   Dental Advisory Given  Plan Discussed with: CRNA and Surgeon  Anesthesia Plan Comments: ( )       Anesthesia Quick Evaluation

## 2016-12-02 NOTE — Progress Notes (Signed)
Assisted Dr. Cristela BlueKyle Jackson with left, ultrasound guided, femoral nerve block. Side rails up, monitors on throughout procedure. See vital signs in flow sheet. Tolerated Procedure well.

## 2016-12-03 ENCOUNTER — Encounter (HOSPITAL_BASED_OUTPATIENT_CLINIC_OR_DEPARTMENT_OTHER): Payer: Self-pay | Admitting: Orthopedic Surgery

## 2016-12-03 DIAGNOSIS — S82142A Displaced bicondylar fracture of left tibia, initial encounter for closed fracture: Secondary | ICD-10-CM | POA: Diagnosis not present

## 2018-12-02 IMAGING — DX DG KNEE COMPLETE 4+V*L*
4 series · 4 of 4 positions shown · non-contrast
Comparison: None.

CLINICAL DATA: 26 y/o  M; crush injury of the knee with pain.

EXAM:
LEFT KNEE - COMPLETE 4+ VIEW

[knee ap]
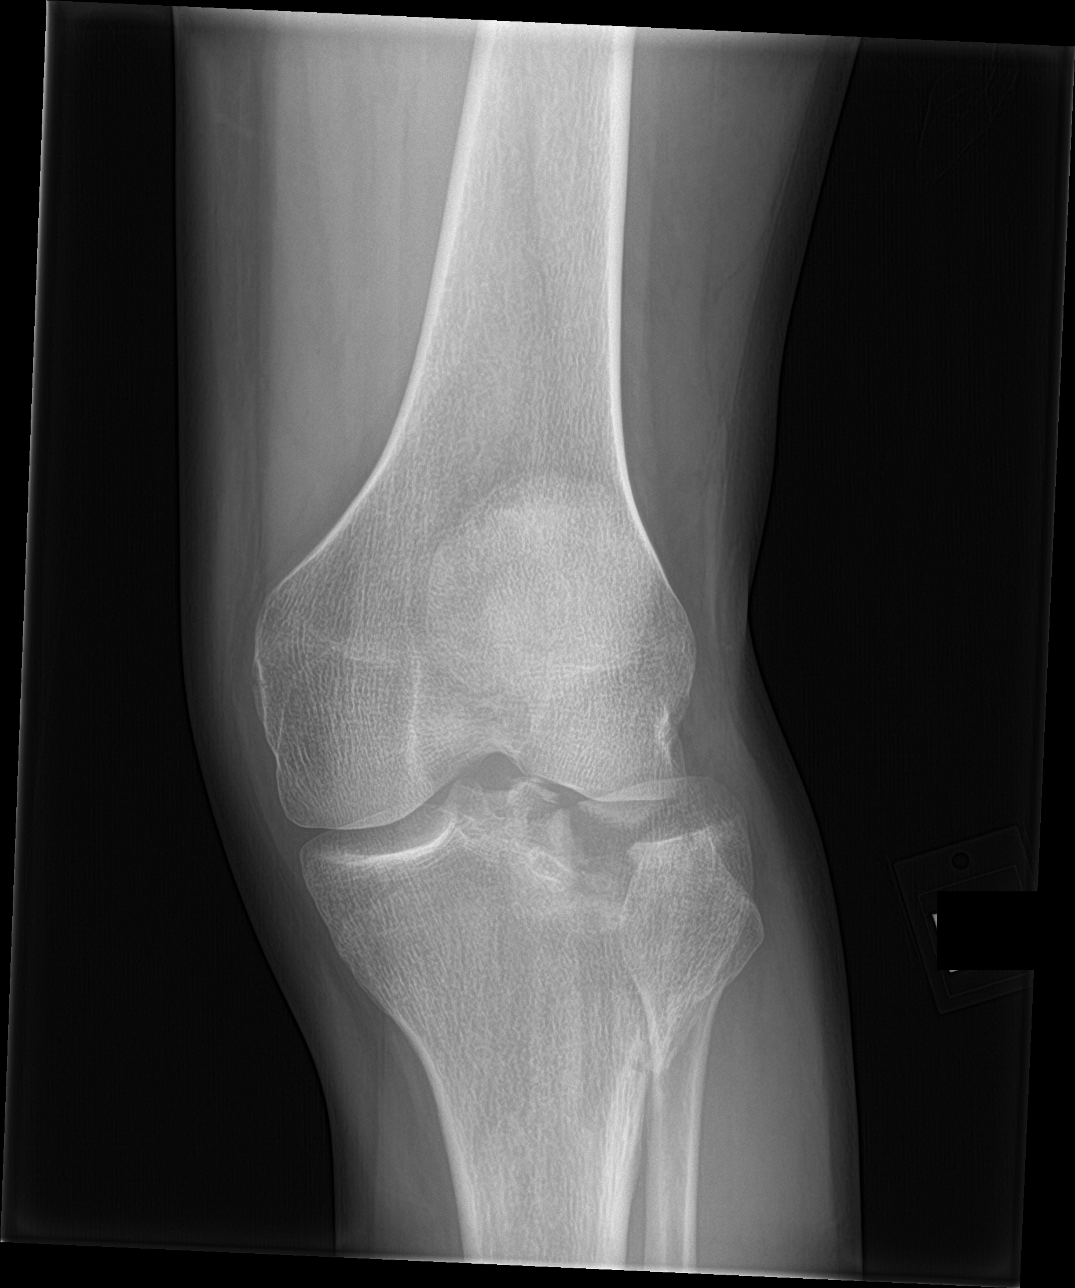

[tunnel]
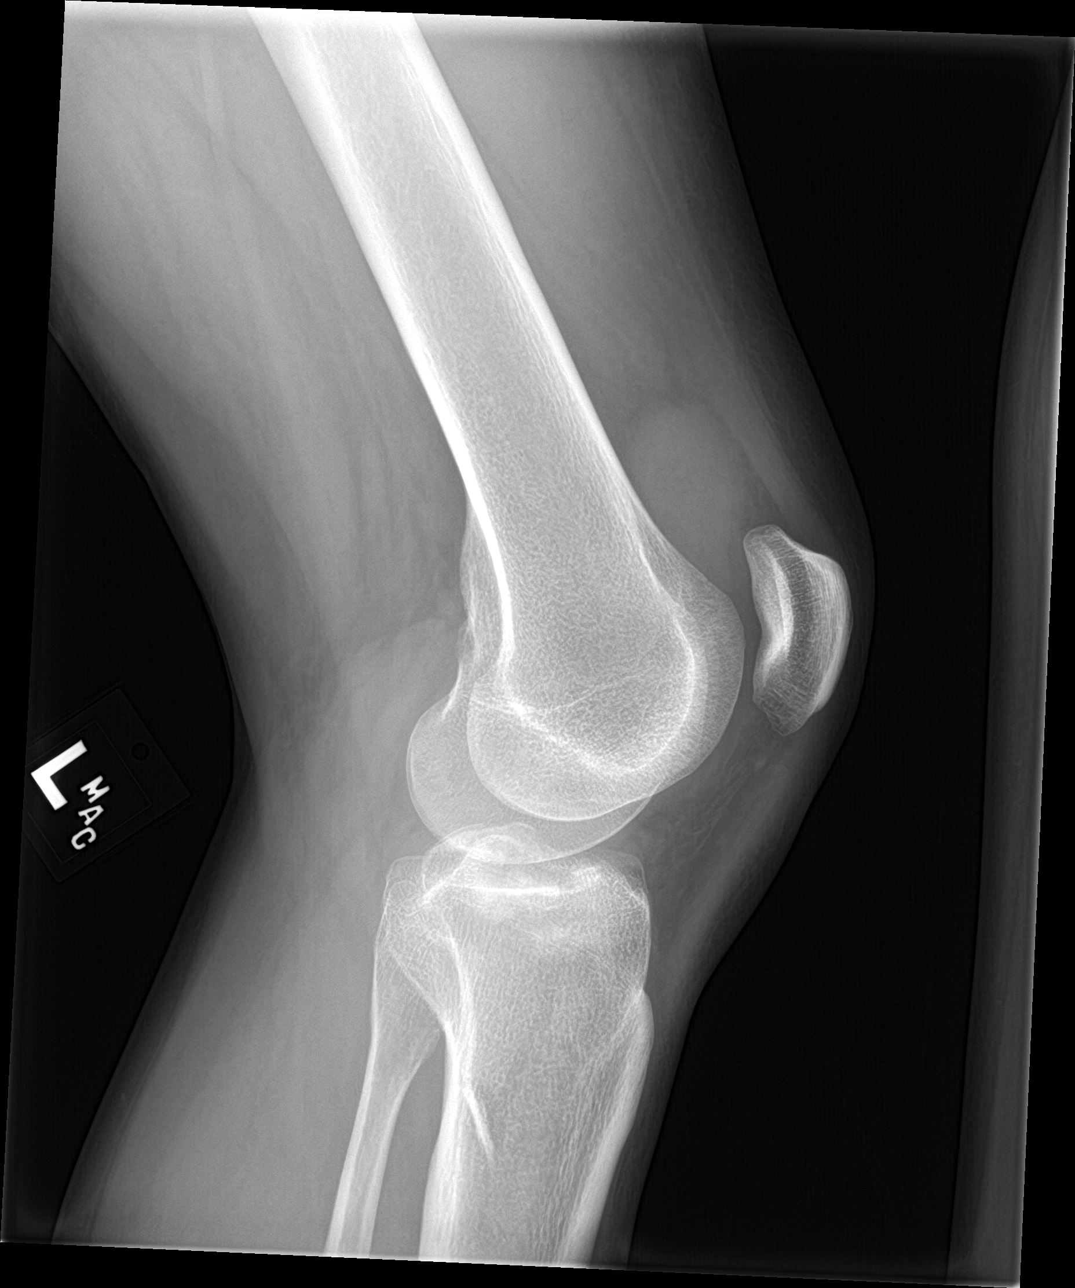

[knee obl (1 of 2)]
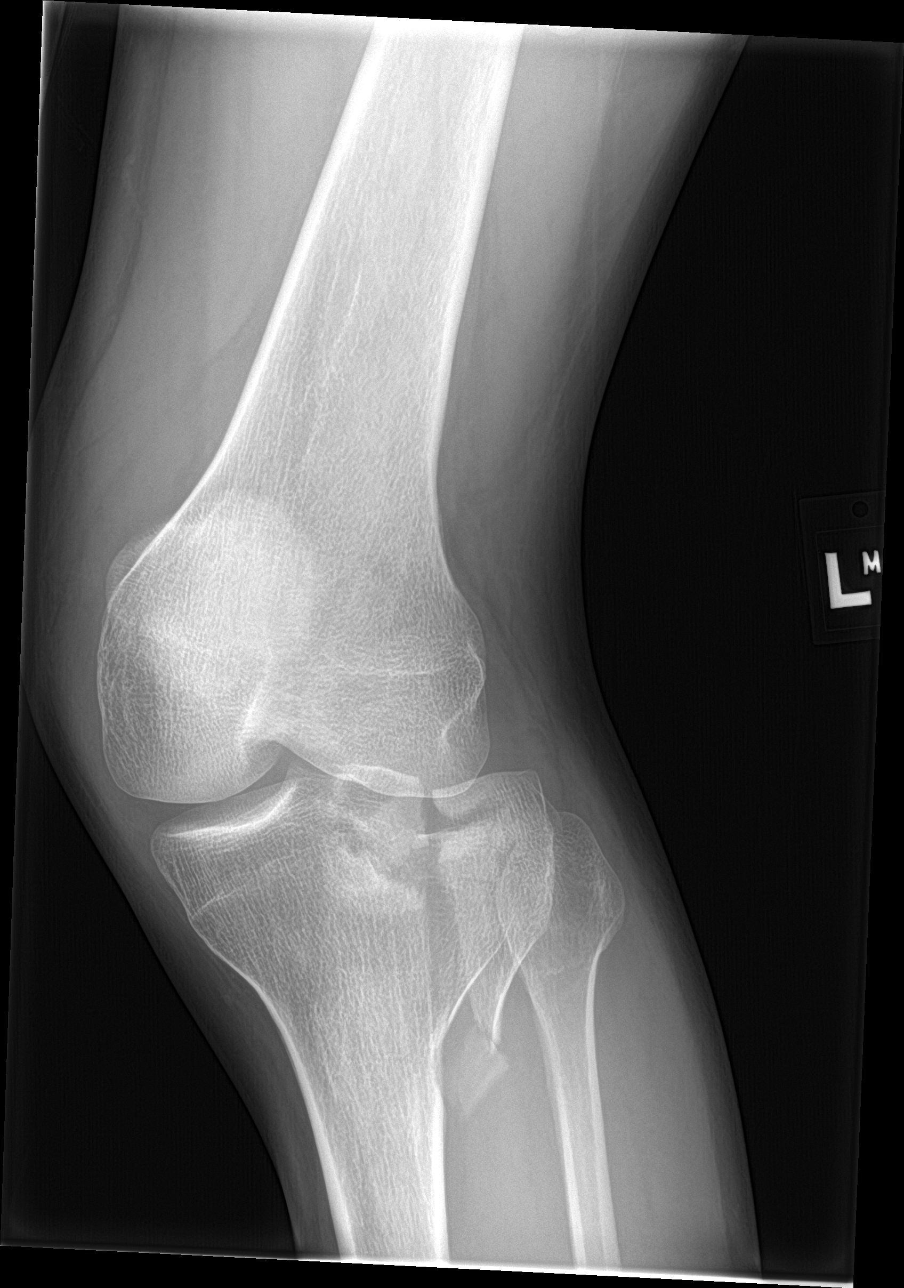

[knee obl (2 of 2)]
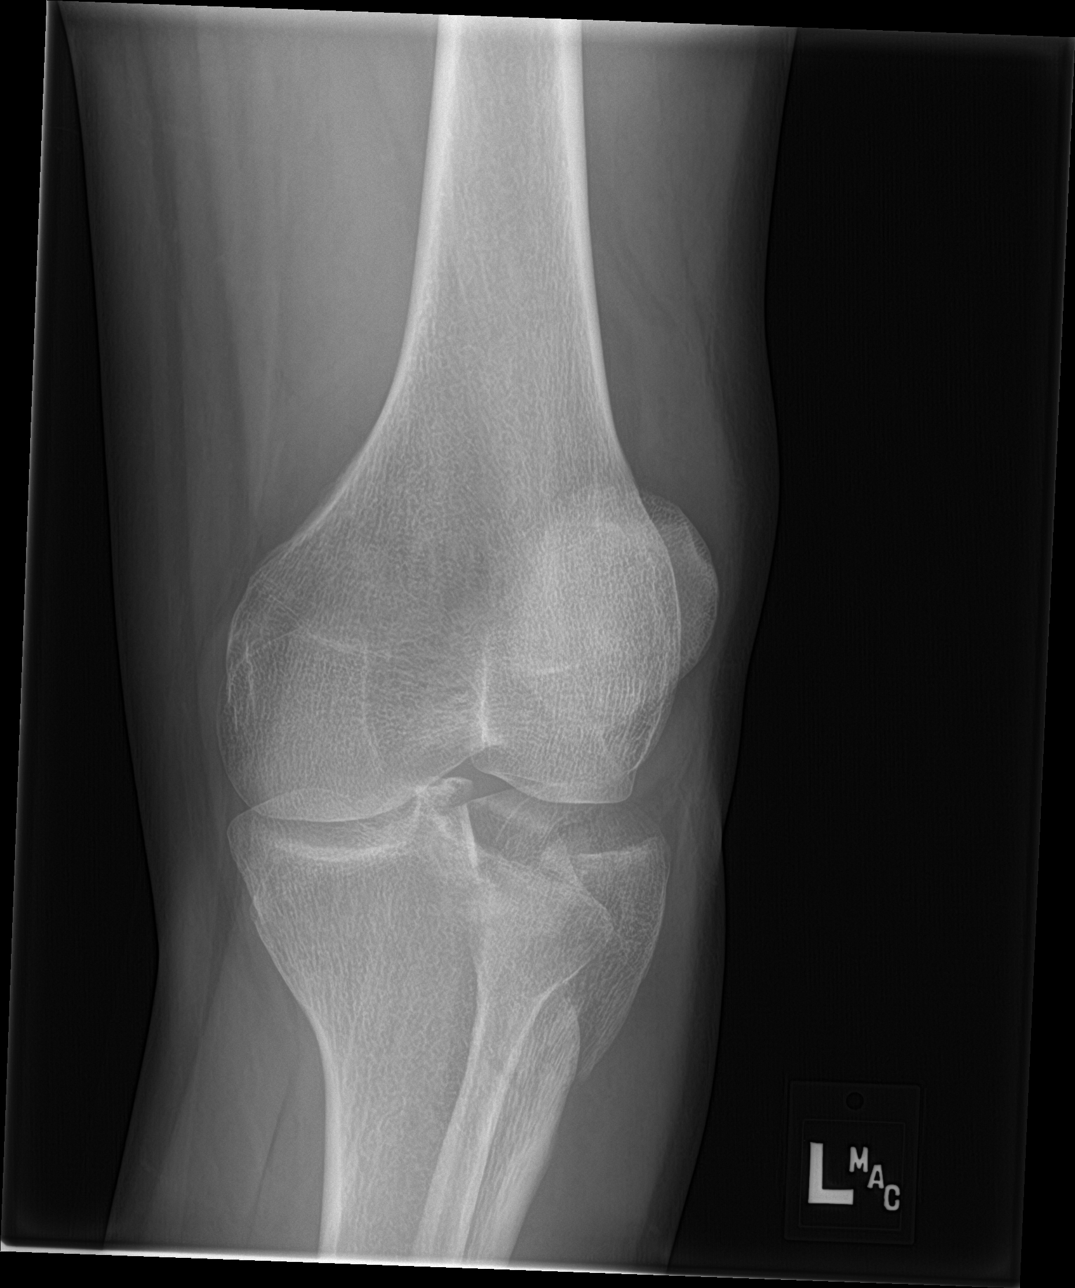

[4 of 4 positions shown; findings below may reference images not displayed]

FINDINGS: Comminuted acute fracture with mild displacement involving the
lateral tibial plateau extending into the proximal metaphysis.
Fracture line extends to the lateral tibial spine. No joint
dislocation. Moderate joint effusion.
IMPRESSION: Acute comminuted and displaced fracture of the lateral tibial
plateau with fractures extending into lateral tibial spine and
proximal metaphysis. Moderate joint effusion.

By: Blade Aujla M.D.

## 2018-12-28 ENCOUNTER — Ambulatory Visit: Payer: HRSA Program | Attending: Internal Medicine

## 2018-12-28 DIAGNOSIS — Z20822 Contact with and (suspected) exposure to covid-19: Secondary | ICD-10-CM

## 2018-12-28 DIAGNOSIS — Z20828 Contact with and (suspected) exposure to other viral communicable diseases: Secondary | ICD-10-CM | POA: Diagnosis present

## 2018-12-29 LAB — NOVEL CORONAVIRUS, NAA: SARS-CoV-2, NAA: NOT DETECTED

## 2019-03-14 ENCOUNTER — Ambulatory Visit: Payer: HRSA Program | Attending: Internal Medicine

## 2019-03-14 DIAGNOSIS — Z20822 Contact with and (suspected) exposure to covid-19: Secondary | ICD-10-CM | POA: Diagnosis present

## 2019-03-15 LAB — NOVEL CORONAVIRUS, NAA: SARS-CoV-2, NAA: NOT DETECTED

## 2019-04-12 ENCOUNTER — Other Ambulatory Visit: Payer: Self-pay

## 2019-04-12 ENCOUNTER — Emergency Department (HOSPITAL_BASED_OUTPATIENT_CLINIC_OR_DEPARTMENT_OTHER)
Admission: EM | Admit: 2019-04-12 | Discharge: 2019-04-13 | Disposition: A | Discharge status: Home / Self Care | Payer: Self-pay | Attending: Emergency Medicine | Admitting: Emergency Medicine

## 2019-04-12 ENCOUNTER — Encounter (HOSPITAL_BASED_OUTPATIENT_CLINIC_OR_DEPARTMENT_OTHER): Payer: Self-pay | Admitting: *Deleted

## 2019-04-12 DIAGNOSIS — Y92511 Restaurant or cafe as the place of occurrence of the external cause: Secondary | ICD-10-CM | POA: Insufficient documentation

## 2019-04-12 DIAGNOSIS — Y990 Civilian activity done for income or pay: Secondary | ICD-10-CM | POA: Insufficient documentation

## 2019-04-12 DIAGNOSIS — S61012A Laceration without foreign body of left thumb without damage to nail, initial encounter: Secondary | ICD-10-CM | POA: Insufficient documentation

## 2019-04-12 DIAGNOSIS — Y939 Activity, unspecified: Secondary | ICD-10-CM | POA: Insufficient documentation

## 2019-04-12 DIAGNOSIS — W260XXA Contact with knife, initial encounter: Secondary | ICD-10-CM | POA: Insufficient documentation

## 2019-04-12 DIAGNOSIS — Z23 Encounter for immunization: Secondary | ICD-10-CM | POA: Insufficient documentation

## 2019-04-12 DIAGNOSIS — Z87891 Personal history of nicotine dependence: Secondary | ICD-10-CM | POA: Insufficient documentation

## 2019-04-12 NOTE — ED Triage Notes (Signed)
Laceration to left thumb 11 hours PTA. 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

## 2019-04-13 MED ORDER — TETANUS-DIPHTH-ACELL PERTUSSIS 5-2.5-18.5 LF-MCG/0.5 IM SUSP
0.5000 mL | Freq: Once | INTRAMUSCULAR | Status: AC
  Administered 2019-04-13: 0.5 mL via INTRAMUSCULAR
  Filled 2019-04-13: qty 0.5

## 2019-04-13 NOTE — ED Provider Notes (Signed)
[facility] EMERGENCY DEPARTMENT Provider Note  CSN: [id number] Arrival date & time: 04/12/19 2338  Chief Complaint(s) Extremity Laceration  HPI Edwin Coleman is a 29 y.o. male who presents to the emergency department with left arm stiff laceration which occurred approximately 11 hours prior to arrival patient was working as a Training and development officer in [employer].  Patient reports that he cut it with his set of knives which he typically keeps clean.  States that he immediately bandage that and continue to work.  He did not rinse it out.  Nose no other injuries.  He endorses controlled hemostasis.  After work he went home and change the bandage and noticed some heavier bleeding, which appears to be controlled now.  He denies any other physical complaints.  No numbness or tingling.  No redness.  No damage to the nail.  Patient not up-to-date on tetanus vaccination.  HPI  Past Medical History Past Medical History:  Diagnosis Date  . Cough 12/01/2016  . Multiple abrasions 11/25/2016   back  . Tibial plateau fracture, left 11/25/2016   Patient Active Problem List   Diagnosis Date Noted  . Closed fracture of left tibial plateau 12/02/2016  . Tibial plateau fracture, left 11/25/2016   Home Medication(s) Prior to Admission medications   Not on File                                                                                                                                    Past Surgical History Past Surgical History:  Procedure Laterality Date  . ORIF TIBIA PLATEAU Left 12/02/2016   Procedure: OPEN REDUCTION INTERNAL FIXATION (ORIF) LEFT TIBIAL PLATEAU WITH LATERAL MENISCECTOMY;  Surgeon: Renette Butters, MD;  Location: [facility];  Service: Orthopedics;  Laterality: Left;  . WISDOM TOOTH EXTRACTION     Family History History reviewed. No pertinent family history.  Social History Social History   Tobacco Use  . Smoking status: Former Smoker    Quit date: 01/06/2015      Years since quitting: 4.2  . Smokeless tobacco: Never Used  Substance Use Topics  . Alcohol use: Yes    Comment: occasionally  . Drug use: Yes    Types: Marijuana    Comment: last used 11/30/2016   Allergies Patient has no known allergies.  Review of Systems Review of Systems All other systems are reviewed and are negative for acute change except as noted in the HPI  Physical Exam Vital Signs  I have reviewed the triage vital signs BP (!) 147/78 (BP Location: Right Arm)   Pulse 84   Temp 98.5 F (36.9 C) (Oral)   Resp 18   Ht 5\' 10"  (1.778 m)   Wt 63.5 kg   SpO2 100%   BMI 20.09 kg/m   Physical Exam Vitals reviewed.  Constitutional:      General: He is not in acute distress.    Appearance:  He is well-developed. He is not diaphoretic.  HENT:     Head: Normocephalic and atraumatic.     Jaw: No trismus.     Right Ear: External ear normal.     Left Ear: External ear normal.     Nose: Nose normal.  Eyes:     General: No scleral icterus.    Conjunctiva/sclera: Conjunctivae normal.  Neck:     Trachea: Phonation normal.  Cardiovascular:     Rate and Rhythm: Normal rate and regular rhythm.  Pulmonary:     Effort: Pulmonary effort is normal. No respiratory distress.     Breath sounds: No stridor.  Abdominal:     General: There is no distension.  Musculoskeletal:        General: Normal range of motion.       Hands:     Cervical back: Normal range of motion.  Neurological:     Mental Status: He is alert and oriented to person, place, and time.  Psychiatric:        Behavior: Behavior normal.     ED Results and Treatments Labs (all labs ordered are listed, but only abnormal results are displayed) Labs Reviewed - No data to display                                                                                                                       EKG  EKG Interpretation  Date/Time:    Ventricular Rate:    PR Interval:    QRS Duration:   QT Interval:     QTC Calculation:   R Axis:     Text Interpretation:        Radiology No results found.  Pertinent labs & imaging results that were available during my care of the patient were reviewed by me and considered in my medical decision making (see chart for details).  Medications Ordered in ED Medications  Tdap (BOOSTRIX) injection 0.5 mL (has no administration in time range)                                                                                                                                    Procedures Procedures  (including critical care time)  Medical Decision Making / ED Course I have reviewed the nursing notes for this encounter and the patient's prior records (if available in EHR or on provided paperwork).   Edwin Coleman was evaluated  in Emergency Department on 04/13/2019 for the symptoms described in the history of present illness. He was evaluated in the context of the global COVID-19 pandemic, which necessitated consideration that the patient might be at risk for infection with the SARS-CoV-2 virus that causes COVID-19. Institutional protocols and algorithms that pertain to the evaluation of patients at risk for COVID-19 are in a state of rapid change based on information released by regulatory bodies including the CDC and federal and state organizations. These policies and algorithms were followed during the patient's care in the ED.  Patient with a fingertip skin avulsion/amputation.  No evidence of deep tissue injury or bony involvement.  No foreign body.  No superimposed infection.  Not amicable to closure.  Tetanus updated.  Wound cleaned and irrigated.  Bandaged.      Final Clinical Impression(s) / ED Diagnoses Final diagnoses:  None   The patient appears reasonably screened and/or stabilized for discharge and I doubt any other medical condition or other [facility] requiring further screening, evaluation, or treatment in the ED at this time prior to discharge. Safe for  discharge with strict return precautions.  Disposition: Discharge  Condition: Good  I have discussed the results, Dx and Tx plan with the patient/family who expressed understanding and agree(s) with the plan. Discharge instructions discussed at length. The patient/family was given strict return precautions who verbalized understanding of the instructions. No further questions at time of discharge.    ED Discharge Orders    None     This chart was dictated using voice recognition software.  Despite best efforts to proofread,  errors can occur which can change the documentation meaning.   Nira Conn, MD 04/13/19 [phone]
